# Patient Record
Sex: Male | Born: 1985 | Hispanic: Yes | Marital: Married | Smoking: Current some day smoker
Health system: Southern US, Community
[De-identification: ages and names within clinical notes are randomized; demographics above are authoritative.]

## PROBLEM LIST (undated history)

## (undated) DIAGNOSIS — M549 Dorsalgia, unspecified: Secondary | ICD-10-CM

## (undated) HISTORY — PX: NO PAST SURGERIES: SHX2092

---

## 2016-02-15 ENCOUNTER — Encounter (HOSPITAL_COMMUNITY): Payer: Self-pay | Admitting: Emergency Medicine

## 2016-02-15 ENCOUNTER — Emergency Department (HOSPITAL_COMMUNITY): Payer: Managed Care, Other (non HMO)

## 2016-02-15 ENCOUNTER — Emergency Department (HOSPITAL_COMMUNITY)
Admission: EM | Admit: 2016-02-15 | Discharge: 2016-02-16 | Disposition: A | Discharge status: Home / Self Care | Payer: Managed Care, Other (non HMO) | Attending: Emergency Medicine | Admitting: Emergency Medicine

## 2016-02-15 DIAGNOSIS — R569 Unspecified convulsions: Secondary | ICD-10-CM | POA: Insufficient documentation

## 2016-02-15 DIAGNOSIS — F17200 Nicotine dependence, unspecified, uncomplicated: Secondary | ICD-10-CM | POA: Insufficient documentation

## 2016-02-15 DIAGNOSIS — R55 Syncope and collapse: Secondary | ICD-10-CM | POA: Insufficient documentation

## 2016-02-15 DIAGNOSIS — R4020 Unspecified coma: Secondary | ICD-10-CM

## 2016-02-15 HISTORY — DX: Dorsalgia, unspecified: M54.9

## 2016-02-15 LAB — URINALYSIS, ROUTINE W REFLEX MICROSCOPIC
BILIRUBIN URINE: NEGATIVE
GLUCOSE, UA: NEGATIVE mg/dL
HGB URINE DIPSTICK: NEGATIVE
Ketones, ur: NEGATIVE mg/dL
Leukocytes, UA: NEGATIVE
Nitrite: NEGATIVE
PROTEIN: NEGATIVE mg/dL
Specific Gravity, Urine: 1.006 (ref 1.005–1.030)
pH: 7 (ref 5.0–8.0)

## 2016-02-15 LAB — BASIC METABOLIC PANEL
Anion gap: 9 (ref 5–15)
BUN: 8 mg/dL (ref 6–20)
CALCIUM: 9.5 mg/dL (ref 8.9–10.3)
CO2: 24 mmol/L (ref 22–32)
CREATININE: 1.04 mg/dL (ref 0.61–1.24)
Chloride: 103 mmol/L (ref 101–111)
GFR calc Af Amer: >60 mL/min (ref >60)
GFR calc non Af Amer: >60 mL/min (ref >60)
GLUCOSE: 105 mg/dL — AB (ref 65–99)
Potassium: 3.7 mmol/L (ref 3.5–5.1)
Sodium: 136 mmol/L (ref 135–145)

## 2016-02-15 LAB — CBC
HCT: 41.7 % (ref 39.0–52.0)
Hemoglobin: 14.5 g/dL (ref 13.0–17.0)
MCH: 30 pg (ref 26.0–34.0)
MCHC: 34.8 g/dL (ref 30.0–36.0)
MCV: 86.2 fL (ref 78.0–100.0)
PLATELETS: 262 10*3/uL (ref 150–400)
RBC: 4.84 MIL/uL (ref 4.22–5.81)
RDW: 12.5 % (ref 11.5–15.5)
WBC: 10.8 10*3/uL — ABNORMAL HIGH (ref 4.0–10.5)

## 2016-02-15 LAB — CBG MONITORING, ED: GLUCOSE-CAPILLARY: 97 mg/dL (ref 65–99)

## 2016-02-15 MED ORDER — SODIUM CHLORIDE 0.9 % IV BOLUS (SEPSIS)
1000.0000 mL | Freq: Once | INTRAVENOUS | Status: AC
Start: 2016-02-15 — End: 2016-02-16
  Administered 2016-02-15: 1000 mL via INTRAVENOUS

## 2016-02-15 NOTE — ED Notes (Signed)
Patient transported to CT 

## 2016-02-15 NOTE — ED Triage Notes (Signed)
Pt states "I was sitting at the table to eat, and my middle finger on my L hand started pulsing, like I had sprained my finger but I hadn't, it kinda started bugging me, and I guess I blacked out and shook a little bit, its happened once before, they couldn't figure it out. I wore a heart monitor and everything and they couldn't figure it out." Pt denies falling. Pt states he drank two energy drinks today. Pt is AAOX4, in NAD.

## 2016-02-15 NOTE — ED Provider Notes (Signed)
[facility]-EMERGENCY DEPT Provider Note   CSN: [id number] Arrival date & time: 02/15/16  1938  By signing my name below, I, Emmanuella Mensah, attest that this documentation has been prepared under the direction and in the presence of Glynn OctaveStephen Srinivas Lippman, MD. Electronically Signed: Angelene GiovanniEmmanuella Mensah, ED Scribe. 02/16/16. 3:05 AM.   History   Chief Complaint Chief Complaint  Patient presents with  . Loss of Consciousness    HPI Comments: Lawrence Thompson is a 30 y.o. male who presents to the Emergency Department for evaluation s/p witnessed syncopal episodes that occurred at 6 pm tonight lasting for 15 seconds. He explains that he was sitting at the table eating tonight when he had pain to his left middle finger he describes as twitching and sudden onset of a "head rush" where he was dizzy with his "eyes going dark". He adds that he had mild shaking during the episode. He denies falling during the episode or sustaining any head injuries; pt was able to finish his meal after the episode. He states that he had 3 monster energy drinks with food throughout the day and smoked marijuana approx 45 minutes prior to the episode. He adds that he normally drinks one energy drink in the morning and smokes marijuana occasionally. He is currently c/o acute exacerbation of his chronic upper back pain and lower back pain. He states that he has had this back pain for 4-5 years after a fall. No alleviating factors noted. Pt has not tried any medications PTA. He has NKDA. He reports that he had a similar episode in August 2016 however he had substernal chest pain he describes as pulling at that time. He states that he was smoking marijuana and drinking a monster energy drink prior to that episodes as well. He reports that after halter monitor and stress test there were no definitive diagnosis. He denies that he was evaluated by a neurologist at that time. He also denies any fever, chest pain, vomiting, bladder incontinence,  tongue biting, diarrhea, shortness of breath, or any other symptoms.   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    The history is provided by the patient. No language interpreter was used.    Past Medical History:  Diagnosis Date  . Back pain     There are no active problems to display for this patient.   History reviewed. No pertinent surgical history.     Home Medications    Prior to Admission medications   Not on File    Family History No family history on file.  Social History Social History  Substance Use Topics  . Smoking status: Current Some Day Smoker  . Smokeless tobacco: Not on file  . Alcohol use Yes     Allergies   Patient has no known allergies.   Review of Systems Review of Systems  A complete 10 system review of systems was obtained and all systems are negative except as noted in the HPI and PMH.    Physical Exam Updated Vital Signs BP 117/68 (BP Location: Left Arm)   Pulse 61   Temp 97.8 F (36.6 C) (Oral)   Resp 16   Ht 5\' 8"  (1.727 m)   Wt 255 lb (115.7 kg)   SpO2 97%   BMI 38.77 kg/m   Physical Exam  Constitutional: He is oriented to person, place, and time. He appears well-developed and well-nourished. No distress.  HENT:  Head: Normocephalic and atraumatic.  Mouth/Throat: Oropharynx is clear and moist. No oropharyngeal exudate.  Eyes:  Conjunctivae and EOM are normal. Pupils are equal, round, and reactive to light.  Neck: Normal range of motion. Neck supple.  No meningismus.  Cardiovascular: Normal rate, regular rhythm, normal heart sounds and intact distal pulses.   No murmur heard. Pulmonary/Chest: Effort normal and breath sounds normal. No respiratory distress.  Abdominal: Soft. There is no tenderness. There is no rebound and no guarding.  Musculoskeletal: Normal range of motion. He exhibits no edema or tenderness.  5/5 strength throughout   Neurological: He is alert and oriented to person, place, and time. No  cranial nerve deficit. He exhibits normal muscle tone. Coordination normal.   5/5 strength throughout. CN 2-12 intact.Equal grip strength.   Skin: Skin is warm.  Psychiatric: He has a normal mood and affect. His behavior is normal.  Nursing note and vitals reviewed.    ED Treatments / Results  DIAGNOSTIC STUDIES: Oxygen Saturation is 97% on RA, adequate by my interpretation.    COORDINATION OF CARE: 11:34 PM- Pt advised of plan for treatment and pt agrees. Pt will receive lab work, CT head, and EKG for further evaluation. He will also receive IV fluids.   3:00 AM - Pt reports that he currently feels fine. He states that he has been able to ambulate here in the ED. He was informed of his lab, x-ray, and EKG results. Advised to quit smoking marijuana and drink energy drinks. Will provide resources for neurology follow up. Recommended that pt should not drive until he is cleared by neurology.   Labs (all labs ordered are listed, but only abnormal results are displayed) Labs Reviewed  BASIC METABOLIC PANEL - Abnormal; Notable for the following:       Result Value   Glucose, Bld 105 (*)    All other components within normal limits  CBC - Abnormal; Notable for the following:    WBC 10.8 (*)    All other components within normal limits  URINALYSIS, ROUTINE W REFLEX MICROSCOPIC (NOT AT [facility]  TROPONIN I  D-DIMER, QUANTITATIVE (NOT AT [facility])  CK  RAPID URINE DRUG SCREEN, HOSP PERFORMED  CBG MONITORING, ED    EKG  EKG Interpretation  Date/Time:  Tuesday February 15 2016 20:14:59 EST Ventricular Rate:  72 PR Interval:  148 QRS Duration: 86 QT Interval:  368 QTC Calculation: 402 R Axis:   72 Text Interpretation:  Normal sinus rhythm Normal ECG No previous ECGs available Confirmed by Manus Gunning  MD, Kden Wagster [phone]) on 02/15/2016 11:02:17 PM       Radiology Ct Head Wo Contrast  Result Date: 02/16/2016 CLINICAL DATA:  Initial evaluation for acute syncope. EXAM: CT HEAD WITHOUT  CONTRAST TECHNIQUE: Contiguous axial images were obtained from the base of the skull through the vertex without intravenous contrast. COMPARISON:  Prior CT from 11/11/2014. FINDINGS: Brain: Cerebral volume within normal limits. No acute intracranial hemorrhage. No evidence for acute large vessel territory infarct. No mass lesion, midline shift or mass effect. No hydrocephalus. No extra-axial fluid collection. Vascular: No hyperdense vessel. Skull: Scalp soft tissues normal.  Calvarium intact. Sinuses/Orbits: Globes and orbital soft tissues within normal limits. Mild scattered mucosal thickening within the ethmoidal air cells. Paranasal sinuses are otherwise clear. No mastoid effusion. IMPRESSION: Normal head CT.  No acute intracranial process identified. Electronically Signed   By: Rise Mu M.D.   On: 02/16/2016 00:10    Procedures Procedures (including critical care time)  Medications Ordered in ED Medications  sodium chloride 0.9 % bolus 1,000 mL (not administered)  Initial Impression / Assessment and Plan / ED Course  Glynn OctaveStephen Tymeer Vaquera, MD has reviewed the triage vital signs and the nursing notes.  Pertinent labs & imaging results that were available during my care of the patient were reviewed by me and considered in my medical decision making (see chart for details).  Clinical Course    Syncopal episode versus possible seizure while sitting at the table. Reports negative syncope workup through cardiology last year including echocardiogram, holter monitor, and stress test. All negative per patient. Denies chest pain or shortness of breath. Denies any headache. No tongue biting or incontinence. He admits to marijuana use.  EKG is normal sinus rhythm, no acute ST changes, no prolonged QT. No brugada. Orthostatics negative  D/w Dr. Otelia LimesLindzen. he does not feel there is enough evidence to start antiepileptic at this time. Patient could be having myoclonic jerking spells possible  partial seizure or possible related to [facility] or caffeine use. Recommend decreasing caffeine and marijuana use. Dr. Otelia LimesLindzen does recommend patient does not drive or operate heavy machinery for 6 months or until cleared by neurologist given the possibility of seizures.  Patient back to baseline. He is tolerating by mouth and ambulatory. Denies any headache. Discussed possibility of seizures with patient the need to refrain from driving until cleared by neurology. Also reduce caffeine and marijuana use. Return precautions discussed. Final Clinical Impressions(s) / ED Diagnoses   Final diagnoses:  LOC (loss of consciousness) (HCC)  Seizure-like activity (HCC)    New Prescriptions New Prescriptions   No medications on file   I personally performed the services described in this documentation, which was scribed in my presence. The recorded information has been reviewed and is accurate.    Glynn OctaveStephen Torsten Weniger, MD 02/16/16 [phone]

## 2016-02-16 LAB — RAPID URINE DRUG SCREEN, HOSP PERFORMED
Amphetamines: NOT DETECTED
BARBITURATES: NOT DETECTED
BENZODIAZEPINES: NOT DETECTED
COCAINE: NOT DETECTED
Opiates: NOT DETECTED
TETRAHYDROCANNABINOL: POSITIVE — AB

## 2016-02-16 LAB — MAGNESIUM: Magnesium: 2.1 mg/dL (ref 1.7–2.4)

## 2016-02-16 LAB — CK: Total CK: 96 U/L (ref 49–397)

## 2016-02-16 LAB — D-DIMER, QUANTITATIVE (NOT AT ARMC)

## 2016-02-16 LAB — TROPONIN I: Troponin I: <0.03 ng/mL

## 2016-02-16 NOTE — Discharge Instructions (Signed)
As we discussed it is possible you might have had a seizure. Reduce your caffeine and marijuana use. Do not drive for 6 months oruntil cleared by the neurologist. Return to the ED if you develop new or worsening symptoms.

## 2016-03-09 ENCOUNTER — Encounter: Payer: Self-pay | Admitting: Neurology

## 2016-03-09 ENCOUNTER — Ambulatory Visit (INDEPENDENT_AMBULATORY_CARE_PROVIDER_SITE_OTHER): Payer: Managed Care, Other (non HMO) | Admitting: Neurology

## 2016-03-09 VITALS — BP 123/80 | HR 59 | Ht 68.0 in | Wt 257.8 lb

## 2016-03-09 DIAGNOSIS — R419 Unspecified symptoms and signs involving cognitive functions and awareness: Secondary | ICD-10-CM

## 2016-03-09 DIAGNOSIS — R404 Transient alteration of awareness: Secondary | ICD-10-CM | POA: Diagnosis not present

## 2016-03-09 DIAGNOSIS — Z789 Other specified health status: Secondary | ICD-10-CM | POA: Diagnosis not present

## 2016-03-09 DIAGNOSIS — F1290 Cannabis use, unspecified, uncomplicated: Secondary | ICD-10-CM

## 2016-03-09 DIAGNOSIS — F1220 Cannabis dependence, uncomplicated: Secondary | ICD-10-CM

## 2016-03-09 NOTE — Patient Instructions (Signed)
Remember to drink plenty of fluid, eat healthy meals and do not skip any meals. Try to eat protein with a every meal and eat a healthy snack such as fruit or nuts in between meals. Try to keep a regular sleep-wake schedule and try to exercise daily, particularly in the form of walking, 20-30 minutes a day, if you can.   As far as diagnostic testing: EEG  I would like to see you back in as needed, sooner if we need to. Please call us with any interim questions, concerns, problems, updates or refill requests.   You are unable to drive, operate heavy machinery, perform activities at heights or participate in water activities until 6 months episode free  Follow up with primary care doctor in 1 month   Our phone number is [phone]. We also have an after hours call service for urgent matters and there is a physician on-call for urgent questions. For any emergencies you know to call 911 or go to the nearest emergency room

## 2016-03-09 NOTE — Progress Notes (Signed)
[facility]    Provider:  Dr Ahern  CC:  Loss of consciousness  HPI:  Lawrence Thompson is a 30 y.o. male here as a referral from the ED for loss of consciousness in the setting of smoking marijuana and energy drinks. PMHx obesity.  15 months ago he was laying down in his kids playroom and he tried to get up, had chest pain, he felt weird, he felt like he was going to pass out and he lost consciousness for 30 seconds, he had been drinking energy drinks and marijuana that day and he "overdid the energy drink" that day. He still drinks energy drinks, he overdid it again with energy drinks he drank 3 Monsters and smoked marijuana and on 11/7 he had another episode where he was "coming in and out of consciousness" at the dinner table and had a "head rush" and had transient loss of awareness for 30 seconds and then finished dinner and went to [employer]. He had mild shaking maybe. No post-ictal confusion. He does not exercise he feels his body is not healthy. He drinks energy drinks daily. No fhx seizures or seizures as a child. No other associated symptoms, no loss of bowel or bladder.  Reviewed notes, labs and imaging from outside physicians, which showed:   BUN 8, Cr 1.04   CT head showed No acute intracranial abnormalities including mass lesion or mass effect, hydrocephalus, extra-axial fluid collection, midline shift, hemorrhage, or acute infarction, large ischemic events (personally reviewed images)     Review of Systems: Patient complains of symptoms per HPI as well as the following symptoms: snoring, chest pain, passing out. Pertinent negatives per HPI. All others negative.   Social History   Social History  . Marital status: Married    Spouse name: Re'ann  . Number of children: 2  . Years of education: 12   Occupational History  . [employer]    Social History Main Topics  . Smoking status: Current Some Day Smoker  . Smokeless tobacco: Never Used  .  Alcohol use Yes     Comment: 2 drinks every other day  . Drug use:     Types: Marijuana     Comment: Smokes one per week per pt (03/09/16-EK, RN)  . Sexual activity: Not on file   Other Topics Concern  . Not on file   Social History Narrative   Lives with wife and kids   Caffeine use: Hx of 1 Monster drink per day, quit 02/16/16.    Family History  Problem Relation Age of Onset  . Seizures Neg Hx     Past Medical History:  Diagnosis Date  . Back pain     Past Surgical History:  Procedure Laterality Date  . NO PAST SURGERIES      No current outpatient prescriptions on file.   No current facility-administered medications for this visit.     Allergies as of 03/09/2016  . (No Known Allergies)    Vitals: BP 123/80 (BP Location: Right Arm, Patient Position: Sitting, Cuff Size: Large)   Pulse (!) 59   Ht 5\' 8"  (1.727 m)   Wt 257 lb 12.8 oz (116.9 kg)   BMI 39.20 kg/m  Last Weight:  Wt Readings from Last 1 Encounters:  03/09/16 257 lb 12.8 oz (116.9 kg)   Last Height:   Ht Readings from Last 1 Encounters:  03/09/16 5\' 8"  (1.727 m)   Physical exam: Exam: Gen: NAD, conversant, well nourised, obese, well groomed  CV: RRR, no MRG. No Carotid Bruits. No peripheral edema, warm, nontender Eyes: Conjunctivae clear without exudates or hemorrhage  Neuro: Detailed Neurologic Exam  Speech:    Speech is normal; fluent and spontaneous with normal comprehension.  Cognition:    The patient is oriented to person, place, and time;     recent and remote memory intact;     language fluent;     normal attention, concentration,     fund of knowledge Cranial Nerves:    The pupils are equal, round, and reactive to light. The fundi are normal and spontaneous venous pulsations are present. Visual fields are full to finger confrontation. Extraocular movements are intact. Trigeminal sensation is intact and the muscles of mastication are normal. The face is  symmetric. The palate elevates in the midline. Hearing intact. Voice is normal. Shoulder shrug is normal. The tongue has normal motion without fasciculations.   Coordination:    Normal finger to nose and heel to shin. Normal rapid alternating movements.   Gait:    Heel-toe and tandem gait are normal.   Motor Observation:    No asymmetry, no atrophy, and no involuntary movements noted. Tone:    Normal muscle tone.    Posture:    Posture is normal. normal erect    Strength:    Strength is V/V in the upper and lower limbs.      Sensation: intact to LT     Reflex Exam:  DTR's:    Deep tendon reflexes in the upper and lower extremities are normal bilaterally.   Toes:    The toes are downgoing bilaterally.   Clonus:    Clonus is absent.       Assessment/Plan:  30 year old with loss of consciousness x2 both times in th setting of multiple Monster energy drinks and marijuana use.  - Stop energy drinks and marijuana - EEG - He declines MRI of the brain - Patient is unable to drive, operate heavy machinery, perform activities at heights or participate in water activities until 6 months episode free, patient acknowledges this. - Discussed Patients with epilepsy have a small risk of sudden unexpected death, a condition referred to as sudden unexpected death in epilepsy (SUDEP). SUDEP is defined specifically as the sudden, unexpected, witnessed or unwitnessed, nontraumatic and nondrowning death in patients with epilepsy with or without evidence for a seizure, and excluding documented status epilepticus, in which post mortem examination does not reveal a structural or toxicologic cause for death  - Needs establishment with pcp within a month - seizure prescautions  Likely a provoked syncopal episode due to excessive energy drinks and marijuana, less likely seizure.   Naomie DeanAntonia Ahern, MD  [facility] [address] [place], [place] [id number]  Phone  [phone] Fax [phone]

## 2018-01-01 IMAGING — CT CT HEAD W/O CM
3 of 4 series · 17 of 47 positions shown, 20 images · non-contrast
Comparison: Prior CT from 11/11/2014.

CLINICAL DATA: Initial evaluation for acute syncope.

EXAM:
CT HEAD WITHOUT CONTRAST
TECHNIQUE: Contiguous axial images were obtained from the base of the skull
through the vertex without intravenous contrast.

[Series 201: head w/o, idose (1) · axial · non-contrast · 0.49mm/px · z∈[+255,+385]mm · 11 of 32 slices shown, 14 images]
[im 3/32  brain]
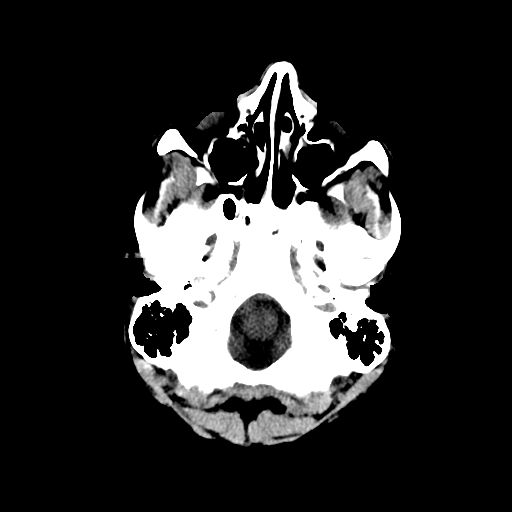
[im 3/32  bone]
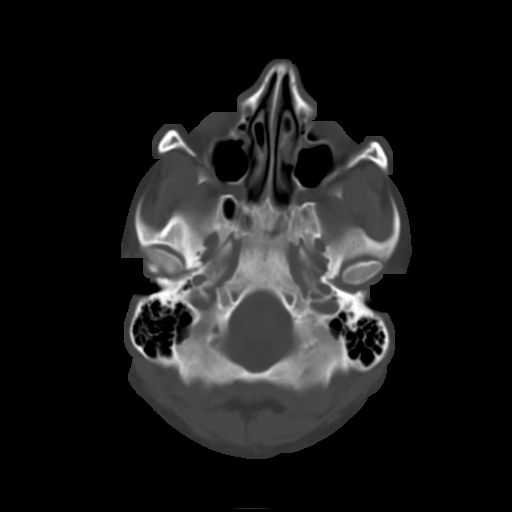
[im 5/32  brain]
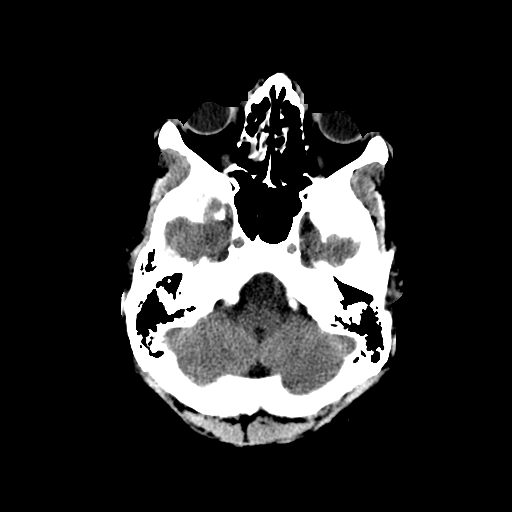
[im 7/32  brain]
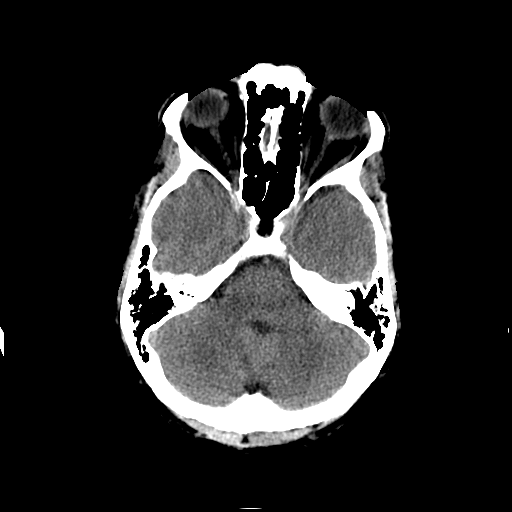
[im 12/32  brain]
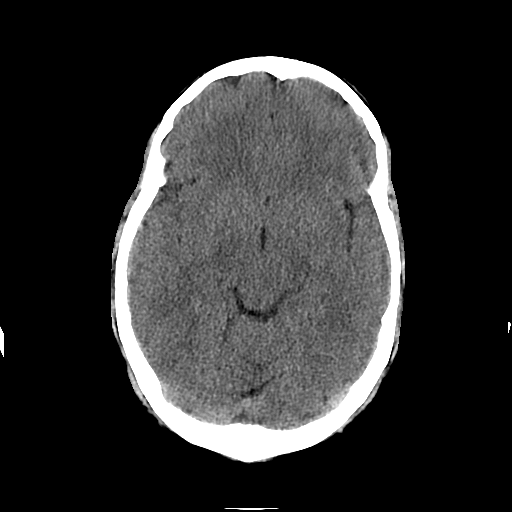
[im 14/32  brain]
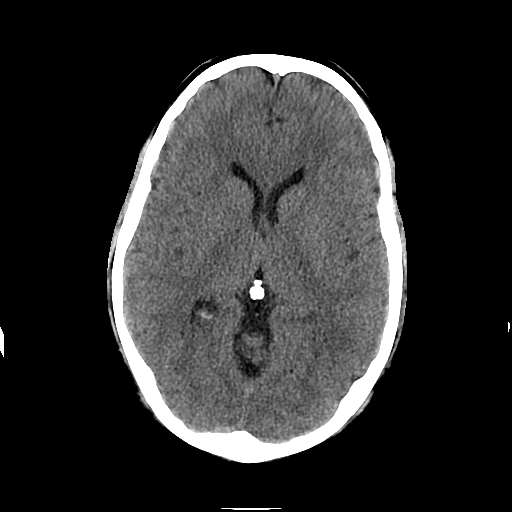
[im 14/32  bone]
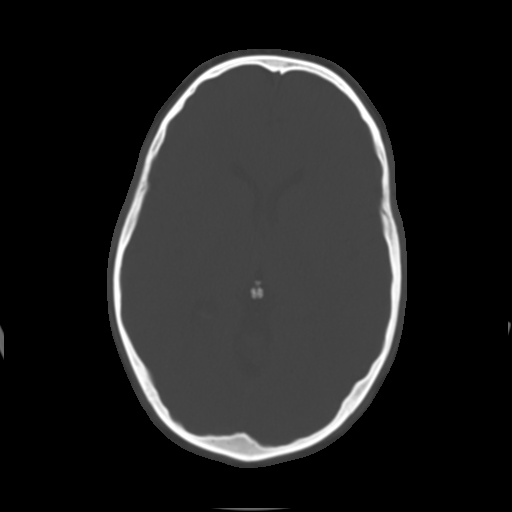
[im 16/32  brain]
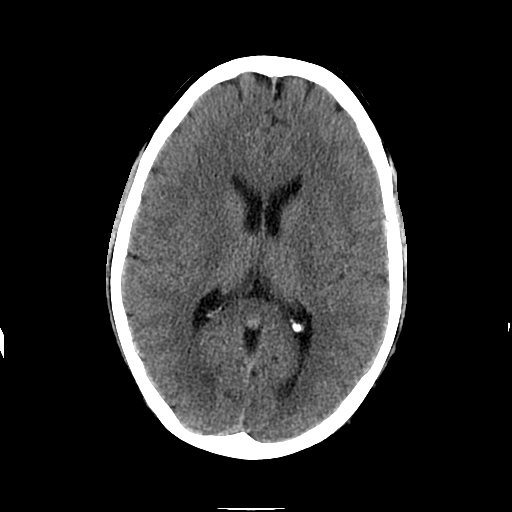
[im 18/32  brain]
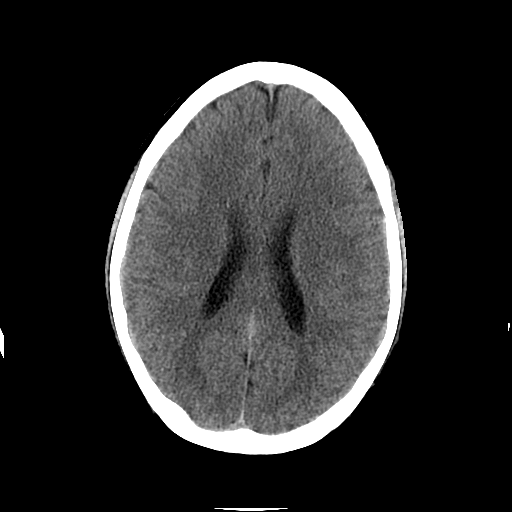
[im 20/32  brain]
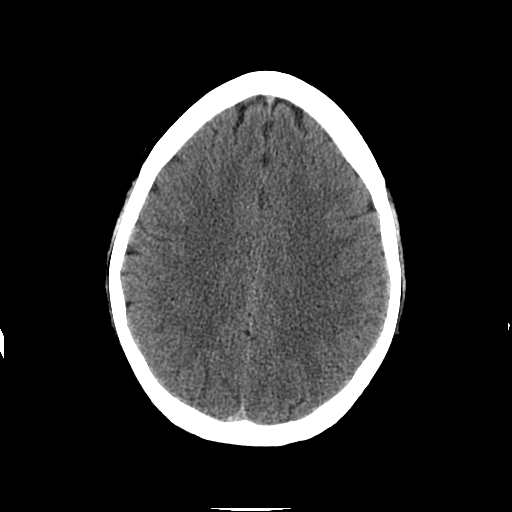
[im 25/32  brain]
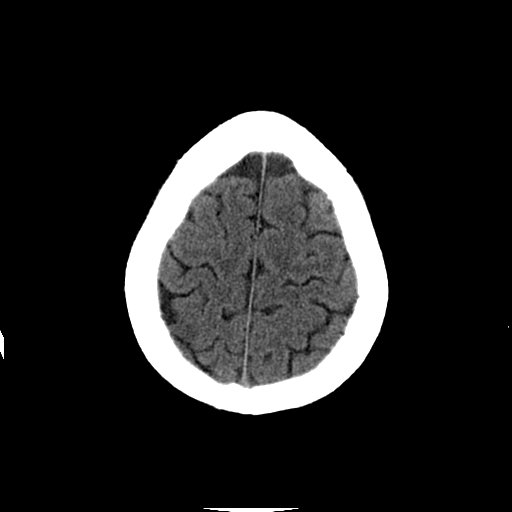
[im 25/32  bone]
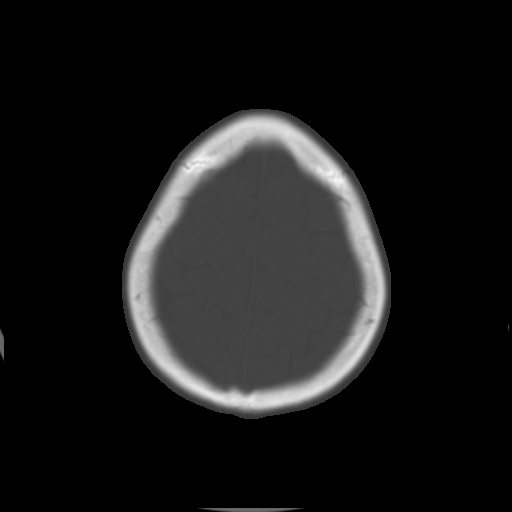
[im 27/32  brain]
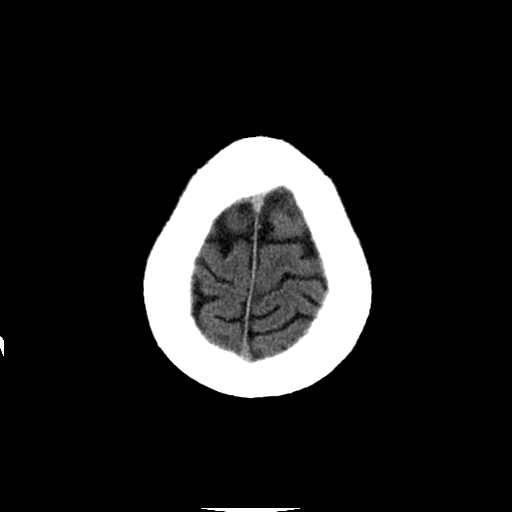
[im 29/32  brain]
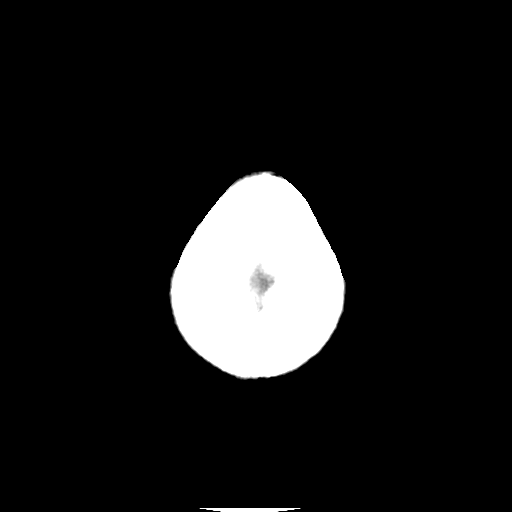

[Series 203: coronal st, idose (1) · coronal · 0.40mm/px · 3 of 76 slices shown]
[im 26/76  brain]
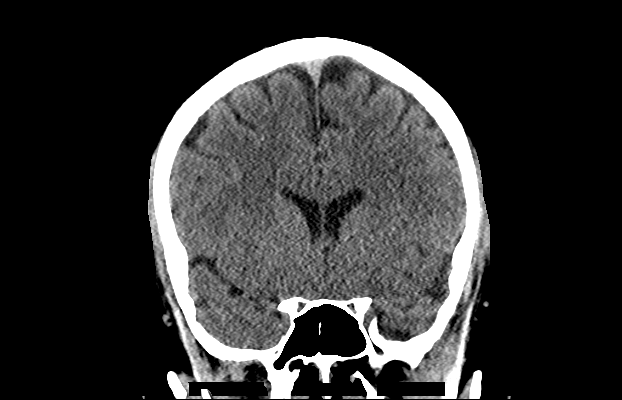
[im 34/76  brain]
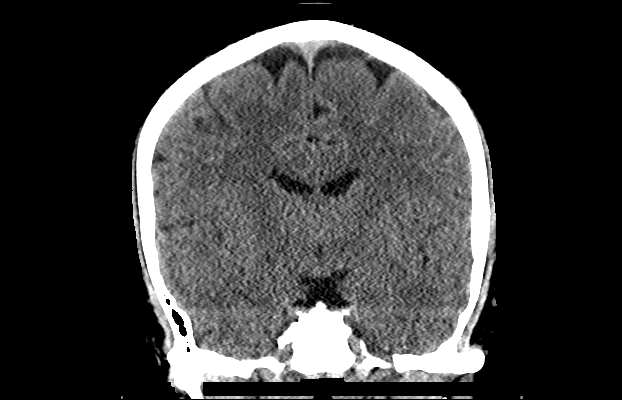
[im 42/76  brain]
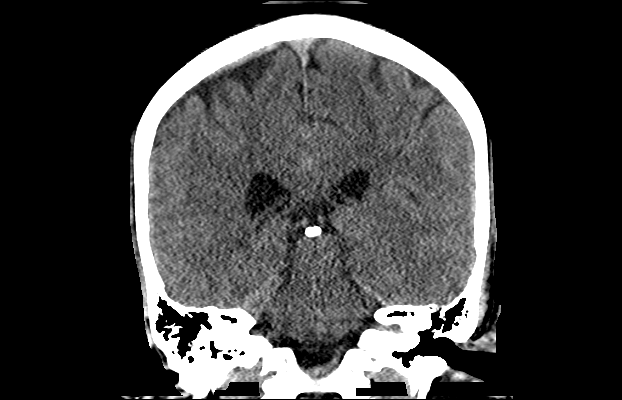

[Series 204: sagittal st, idose (1) · sagittal · 0.40mm/px · 3 of 83 slices shown]
[im 28/83  brain]
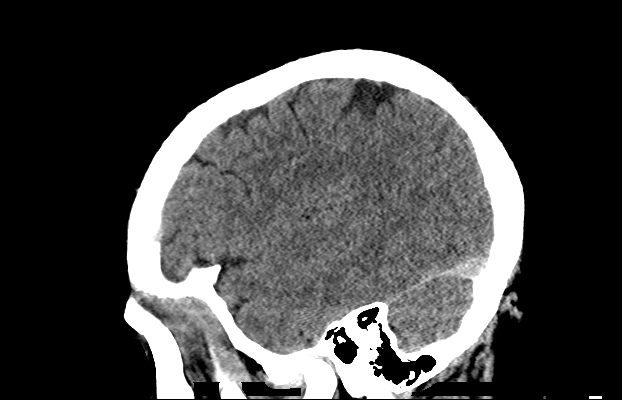
[im 42/83  brain]
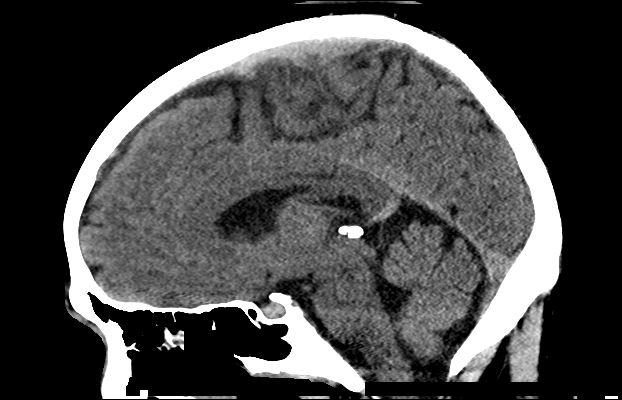
[im 55/83  brain]
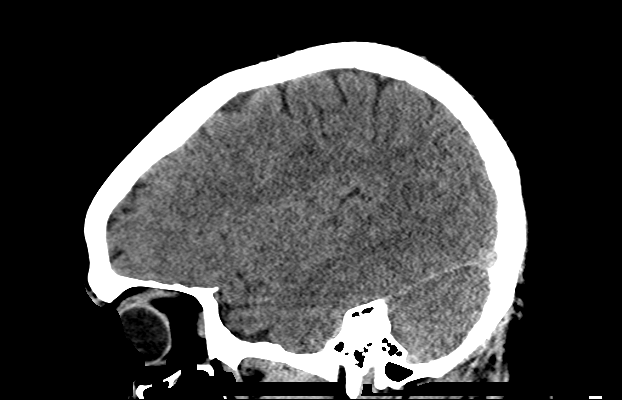

[17 of 47 positions shown; findings below may reference images not displayed]

FINDINGS: Brain: Cerebral volume within normal limits. No acute intracranial
hemorrhage. No evidence for acute large vessel territory infarct. No
mass lesion, midline shift or mass effect. No hydrocephalus. No
extra-axial fluid collection.

Vascular: No hyperdense vessel.

Skull: Scalp soft tissues normal.  Calvarium intact.

Sinuses/Orbits: Globes and orbital soft tissues within normal
limits. Mild scattered mucosal thickening within the ethmoidal air
cells. Paranasal sinuses are otherwise clear. No mastoid effusion.
IMPRESSION: Normal head CT.  No acute intracranial process identified.
